# Patient Record
Sex: Female | Born: 2000 | Hispanic: No | Marital: Single | Smoking: Never smoker
Health system: Southern US, Community
[De-identification: ages and names within clinical notes are randomized; demographics above are authoritative.]

## PROBLEM LIST (undated history)

## (undated) DIAGNOSIS — G43909 Migraine, unspecified, not intractable, without status migrainosus: Secondary | ICD-10-CM

## (undated) DIAGNOSIS — Q185 Microstomia: Secondary | ICD-10-CM

## (undated) DIAGNOSIS — S83249A Other tear of medial meniscus, current injury, unspecified knee, initial encounter: Secondary | ICD-10-CM

## (undated) DIAGNOSIS — F419 Anxiety disorder, unspecified: Secondary | ICD-10-CM

## (undated) DIAGNOSIS — M2631 Crowding of fully erupted teeth: Secondary | ICD-10-CM

## (undated) DIAGNOSIS — S83512A Sprain of anterior cruciate ligament of left knee, initial encounter: Secondary | ICD-10-CM

## (undated) DIAGNOSIS — S83207A Unspecified tear of unspecified meniscus, current injury, left knee, initial encounter: Secondary | ICD-10-CM

---
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
## 2018-01-30 ENCOUNTER — Other Ambulatory Visit: Payer: Self-pay

## 2018-01-30 ENCOUNTER — Encounter: Payer: Self-pay | Admitting: Student

## 2018-01-30 ENCOUNTER — Other Ambulatory Visit (HOSPITAL_COMMUNITY)
Admission: RE | Admit: 2018-01-30 | Discharge: 2018-01-30 | Disposition: A | Discharge status: Home / Self Care | Payer: Medicaid Other | Source: Ambulatory Visit | Attending: Family Medicine | Admitting: Family Medicine

## 2018-01-30 ENCOUNTER — Ambulatory Visit (INDEPENDENT_AMBULATORY_CARE_PROVIDER_SITE_OTHER): Payer: Medicaid Other | Admitting: Student

## 2018-01-30 VITALS — BP 102/70 | HR 92 | Temp 97.9°F | Wt 119.0 lb

## 2018-01-30 DIAGNOSIS — N926 Irregular menstruation, unspecified: Secondary | ICD-10-CM | POA: Diagnosis present

## 2018-01-30 DIAGNOSIS — N898 Other specified noninflammatory disorders of vagina: Secondary | ICD-10-CM | POA: Insufficient documentation

## 2018-01-30 DIAGNOSIS — R109 Unspecified abdominal pain: Secondary | ICD-10-CM | POA: Diagnosis not present

## 2018-01-30 DIAGNOSIS — F39 Unspecified mood [affective] disorder: Secondary | ICD-10-CM | POA: Diagnosis not present

## 2018-01-30 LAB — POCT URINE PREGNANCY: Preg Test, Ur: NEGATIVE

## 2018-01-30 LAB — POCT WET PREP (WET MOUNT)
Clue Cells Wet Prep Whiff POC: NEGATIVE
Trichomonas Wet Prep HPF POC: ABSENT
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
## 2018-01-30 NOTE — Patient Instructions (Addendum)
It was great seeing you today! We have addressed the following issues today  Bloating and cramping: this could be due to anxiety.  It could also be due to acid reflux. We gave you a number to our behavioral health clinicians if you would like to talk to someone about nonpharmacological management of anxiety/stress.  You can also try acid reflux medication such as ranitidine/Zantac which is available over-the-counter.  Vaginal discharge: Your test for bacterial vaginosis, yeast and trichomoniasis is negative.  We will let you know about you at the practice in the next 2 to 3 days if anything is remarkable.  Your pregnancy test is negative.  If we did any lab work today, and the results require attention, either me or my nurse will get in touch with you. If everything is normal, you will get a letter in mail and a message via . If you don't hear from us in two weeks, please give us a call. Otherwise, we look forward to seeing you again at your next visit. If you have any questions or concerns before then, please call the clinic at [phone].  Please bring all your medications to every doctors visit  Sign up for My Chart to have easy access to your labs results, and communication with your Primary care physician.    Please check-out at the front desk before leaving the clinic.    Take Care,   Dr. Alanda SlimGonfa

## 2018-01-31 ENCOUNTER — Encounter: Payer: Self-pay | Admitting: Student

## 2018-01-31 LAB — CERVICOVAGINAL ANCILLARY ONLY
Chlamydia: NEGATIVE
Neisseria Gonorrhea: NEGATIVE

## 2018-09-18 ENCOUNTER — Other Ambulatory Visit: Payer: Self-pay

## 2018-09-18 ENCOUNTER — Ambulatory Visit (INDEPENDENT_AMBULATORY_CARE_PROVIDER_SITE_OTHER): Payer: Medicaid Other | Admitting: Family Medicine

## 2018-09-18 ENCOUNTER — Encounter: Payer: Self-pay | Admitting: Family Medicine

## 2018-09-18 VITALS — BP 100/70 | HR 90 | Temp 98.0°F | Wt 120.4 lb

## 2018-09-18 DIAGNOSIS — R432 Parageusia: Secondary | ICD-10-CM | POA: Diagnosis not present

## 2018-09-18 DIAGNOSIS — H1013 Acute atopic conjunctivitis, bilateral: Secondary | ICD-10-CM | POA: Diagnosis present

## 2018-09-18 DIAGNOSIS — Z23 Encounter for immunization: Secondary | ICD-10-CM

## 2018-09-18 MED ORDER — OLOPATADINE HCL 0.1 % OP SOLN
1.0000 [drp] | Freq: Two times a day (BID) | OPHTHALMIC | 12 refills | Status: AC
Start: 2018-09-18 — End: ?

## 2018-09-18 MED ORDER — TETANUS-DIPHTH-ACELL PERTUSSIS 5-2.5-18.5 LF-MCG/0.5 IM SUSP: 0.5000 mL | Freq: Once | INTRAMUSCULAR | 0 refills | Status: AC

## 2018-09-18 NOTE — Progress Notes (Signed)
    Subjective:    Patient ID: Amanda Acevedo, female    DOB: 30-May-2001, 18 y.o.   MRN: [id number]   CC: loss of taste  HPI: patient reports about a week ago she noticed foods tasted very bland to her. She was unable to taste very spicy or sweet foods. This was obviously worrisome so she made appointment to make sure things were ok. She reports taste has slowly started to come back and is about 80% back to normal today. She endorses nasal congestion and rhinorrhea last week as well. No fevers or chills. No rashes, joint pains, dry mouth, dry eyes. She denies seasonal allergies but does endorse itchy watery eyes frequently.    Smoking status reviewed- non-smoker  Review of Systems-see HPI   Objective:  BP 100/70   Pulse 90   Temp 98 F (36.7 C) (Oral)   Wt 120 lb 6 oz (54.6 kg)   LMP 09/02/2018   SpO2 99%   BMI 20.66 kg/m  Vitals and nursing note reviewed  General: well nourished, in no acute distress HEENT: normocephalic, TM's visualized bilaterally are normal, no bulging or erythema, no scleral icterus or conjunctival pallor, no nasal discharge, moist mucous membranes, good dentition without erythema or discharge noted in posterior oropharynx Neck: supple, non-tender, without lymphadenopathy Cardiac: RRR, clear S1 and S2, no murmurs, rubs, or gallops Respiratory: clear to auscultation bilaterally, no increased work of breathing Neuro: alert and oriented, no focal deficits   Assessment & Plan:    1. Allergic conjunctivitis of both eyes rx for pataday sent to pharmacy  2. Dysgeusia Likely related to nasal congestion, self resolving. Asked patient to return if she feels taste does not completely return or she has other concerns. At that time could do autoimmune work up.   3. Need for immunization against influenza - Flu Vaccine QUAD 36+ mos IM   Return as needed.   Dolores Patty, DO Family Medicine Resident PGY-3

## 2018-09-18 NOTE — Patient Instructions (Addendum)
  Please use eye drops twice a day for watery/itchy eyes.  If you feel your sense of taste does not recover totally or goes away again please let me know!  If you have questions or concerns please do not hesitate to call at [phone]. Dolores Patty, DO PGY-3, [facility] 09/18/2018 2:59 PM   Allergic Conjunctivitis A clear membrane (conjunctiva) covers the white part of your eye and the inner surface of your eyelid. Allergic conjunctivitis happens when this membrane has inflammation. This is caused by allergies. Common causes of allergic reactions (allergens)include:  Outdoor allergens, such as: ? Pollen. ? Grass and weeds. ? Mold spores.  Indoor allergens, such as: ? Dust. ? Smoke. ? Mold. ? Pet dander. ? Animal hair. This condition can make your eye red or pink. It can also make your eye feel itchy. This condition cannot be spread from one person to another person (is not contagious). Follow these instructions at home:  Try not to be around things that you are allergic to.  Take or apply over-the-counter and prescription medicines only as told by your doctor. These include any eye drops.  Place a cool, clean washcloth on your eye for 10-20 minutes. Do this 3-4 times a day.  Do not touch or rub your eyes.  Do not wear contact lenses until the inflammation is gone. Wear glasses instead.  Do not wear eye makeup until the inflammation is gone.  Keep all follow-up visits as told by your doctor. This is important. Contact a doctor if:  Your symptoms get worse.  Your symptoms do not get better with treatment.  You have mild eye pain.  You are sensitive to light,  You have spots or blisters on your eyes.  You have pus coming from your eye.  You have a fever. Get help right away if:  You have redness, swelling, or other symptoms in only one eye.  Your vision is blurry.  You have vision changes.  You have very bad eye pain. Summary  Allergic  conjunctivitis is caused by allergies. It can make your eye red or pink, and it can make your eye feel itchy.  This condition cannot be spread from one person to another person (is not contagious).  Try not to be around things that you are allergic to.  Take or apply over-the-counter and prescription medicines only as told by your doctor. These include any eye drops.  Contact your doctor if your symptoms get worse or they do not get better with treatment. This information is not intended to replace advice given to you by your health care provider. Make sure you discuss any questions you have with your health care provider. Document Released: 01/11/2010 Document Revised: 03/17/2016 Document Reviewed: 03/17/2016 Elsevier Interactive Patient Education  [employer].

## 2019-10-06 ENCOUNTER — Encounter: Payer: Self-pay | Admitting: Family Medicine

## 2019-10-06 ENCOUNTER — Ambulatory Visit (INDEPENDENT_AMBULATORY_CARE_PROVIDER_SITE_OTHER): Payer: Medicaid Other | Admitting: Family Medicine

## 2019-10-06 ENCOUNTER — Other Ambulatory Visit: Payer: Self-pay

## 2019-10-06 VITALS — BP 110/78 | HR 114 | Wt 130.0 lb

## 2019-10-06 DIAGNOSIS — F419 Anxiety disorder, unspecified: Secondary | ICD-10-CM

## 2019-10-06 DIAGNOSIS — F329 Major depressive disorder, single episode, unspecified: Secondary | ICD-10-CM | POA: Diagnosis not present

## 2019-10-06 DIAGNOSIS — F418 Other specified anxiety disorders: Secondary | ICD-10-CM

## 2019-10-06 DIAGNOSIS — F32A Depression, unspecified: Secondary | ICD-10-CM

## 2019-10-06 MED ORDER — ESCITALOPRAM OXALATE 10 MG PO TABS: 10.0000 mg | ORAL_TABLET | Freq: Every day | ORAL | 3 refills | Status: AC

## 2019-10-06 NOTE — Patient Instructions (Signed)
Generalized Anxiety Disorder, Adult Generalized anxiety disorder (GAD) is a mental health disorder. People with this condition constantly worry about everyday events. Unlike normal anxiety, worry related to GAD is not triggered by a specific event. These worries also do not fade or get better with time. GAD interferes with life functions, including relationships, work, and school. GAD can vary from mild to severe. People with severe GAD can have intense waves of anxiety with physical symptoms (panic attacks). What are the causes? The exact cause of GAD is not known. What increases the risk? This condition is more likely to develop in:  Women.  People who have a family history of anxiety disorders.  People who are very shy.  People who experience very stressful life events, such as the death of a loved one.  People who have a very stressful family environment. What are the signs or symptoms? People with GAD often worry excessively about many things in their lives, such as their health and family. They may also be overly concerned about:  Doing well at work.  Being on time.  Natural disasters.  Friendships. Physical symptoms of GAD include:  Fatigue.  Muscle tension or having muscle twitches.  Trembling or feeling shaky.  Being easily startled.  Feeling like your heart is pounding or racing.  Feeling out of breath or like you cannot take a deep breath.  Having trouble falling asleep or staying asleep.  Sweating.  Nausea, diarrhea, or irritable bowel syndrome (IBS).  Headaches.  Trouble concentrating or remembering facts.  Restlessness.  Irritability. How is this diagnosed? Your health care provider can diagnose GAD based on your symptoms and medical history. You will also have a physical exam. The health care provider will ask specific questions about your symptoms, including how severe they are, when they started, and if they come and go. Your health care  provider may ask you about your use of alcohol or drugs, including prescription medicines. Your health care provider may refer you to a mental health specialist for further evaluation. Your health care provider will do a thorough examination and may perform additional tests to rule out other possible causes of your symptoms. To be diagnosed with GAD, a person must have anxiety that:  Is out of his or her control.  Affects several different aspects of his or her life, such as work and relationships.  Causes distress that makes him or her unable to take part in normal activities.  Includes at least three physical symptoms of GAD, such as restlessness, fatigue, trouble concentrating, irritability, muscle tension, or sleep problems. Before your health care provider can confirm a diagnosis of GAD, these symptoms must be present more days than they are not, and they must last for six months or longer. How is this treated? The following therapies are usually used to treat GAD:  Medicine. Antidepressant medicine is usually prescribed for long-term daily control. Antianxiety medicines may be added in severe cases, especially when panic attacks occur.  Talk therapy (psychotherapy). Certain types of talk therapy can be helpful in treating GAD by providing support, education, and guidance. Options include: ? Cognitive behavioral therapy (CBT). People learn coping skills and techniques to ease their anxiety. They learn to identify unrealistic or negative thoughts and behaviors and to replace them with positive ones. ? Acceptance and commitment therapy (ACT). This treatment teaches people how to be mindful as a way to cope with unwanted thoughts and feelings. ? Biofeedback. This process trains you to manage your body's response (  physiological response) through breathing techniques and relaxation methods. You will work with a therapist while machines are used to monitor your physical symptoms.  Stress  management techniques. These include yoga, meditation, and exercise. A mental health specialist can help determine which treatment is best for you. Some people see improvement with one type of therapy. However, other people require a combination of therapies. Follow these instructions at home:  Take over-the-counter and prescription medicines only as told by your health care provider.  Try to maintain a normal routine.  Try to anticipate stressful situations and allow extra time to manage them.  Practice any stress management or self-calming techniques as taught by your health care provider.  Do not punish yourself for setbacks or for not making progress.  Try to recognize your accomplishments, even if they are small.  Keep all follow-up visits as told by your health care provider. This is important. Contact a health care provider if:  Your symptoms do not get better.  Your symptoms get worse.  You have signs of depression, such as: ? A persistently sad, cranky, or irritable mood. ? Loss of enjoyment in activities that used to bring you joy. ? Change in weight or eating. ? Changes in sleeping habits. ? Avoiding friends or family members. ? Loss of energy for normal tasks. ? Feelings of guilt or worthlessness. Get help right away if:  You have serious thoughts about hurting yourself or others. If you ever feel like you may hurt yourself or others, or have thoughts about taking your own life, get help right away. You can go to your nearest emergency department or call:  Your local emergency services (911 in the [place]  A suicide crisis helpline, such as the National Suicide Prevention Lifeline at 236-506-3342. This is open 24 hours a day. Summary  Generalized anxiety disorder (GAD) is a mental health disorder that involves worry that is not triggered by a specific event.  People with GAD often worry excessively about many things in their lives, such as their health and  family.  GAD may cause physical symptoms such as restlessness, trouble concentrating, sleep problems, frequent sweating, nausea, diarrhea, headaches, and trembling or muscle twitching.  A mental health specialist can help determine which treatment is best for you. Some people see improvement with one type of therapy. However, other people require a combination of therapies. This information is not intended to replace advice given to you by your health care provider. Make sure you discuss any questions you have with your health care provider. Document Revised: 07/06/2017 Document Reviewed: 06/13/2016 Elsevier Patient Education  2020 [employer].  Support in a Crisis What if I or someone I know is in crisis?  . If you are thinking about harming yourself or having thoughts of suicide, or if you know someone who is, seek help right away. . Call your doctor or mental health care provider. . Call 911 or go to a hospital emergency room to get immediate help, or ask a friend or family member to help you do these things. . Call the [place] National Suicide Prevention Lifeline's toll-free, 24-hour hotline at 1-800-273-TALK 606-031-2487) or TTY: 1-800-799-4 TTY 605-472-8910) to talk to a trained counselor. . If you are in crisis, make sure you are not left alone.  . If someone else is in crisis, make sure he or she is not left alone  24 Hour Availability [facility]  [address], [place], [place] [address]  [phone] or [phone]  [facility] [employer] (Domestic Violence, Rape & Victim Assistance [phone]  [place]  [address], [place]  [address]               [phone] or [phone]  [place]    (ONLY from 8am-4pm)    [phone]  [facility] (24/7)   4588632762  [place] National Suicide Hotline   509-536-5903  Diamantina Monks)

## 2019-10-08 ENCOUNTER — Telehealth: Payer: Self-pay

## 2019-10-08 ENCOUNTER — Encounter: Payer: Self-pay | Admitting: Family Medicine

## 2019-10-08 NOTE — Telephone Encounter (Signed)
Patient calls nurse line stating she experienced a panic attack in the middle of the night. Patient did state she feels it was more food related, as she had beef intestines and does not they were cooked all the way. Patient stated she had "GI" issues and then experienced the attack. Patient states she feels much better today, and believes she just had a touch of food poisoning.Denies fevers or chills or continues GI upset. Patient also stated she does not feel the medication give to her by Janee Morn on 3/1 is helping. In fact, patient stated she felt worse on it. I advised her to stop taking. I offered her a virtual to discuss switching to an alternative. Patient declined and stated, "I feel better when I am not on anything." Patient denies harming herself or others. Patient was told to schedule an apt if she feels her anxiety is not manageable. Patient agreed.

## 2019-10-08 NOTE — Progress Notes (Signed)
    SUBJECTIVE:   CHIEF COMPLAINT / HPI:   Patient reports worsening anxiety over the past several months.  Patient with significant history of generalized anxiety disorder.  She has been on medication in the past.  She has not followed up with pediatric psychiatry in the past.  Prior medications include Zoloft and Seroquel.  Patient discontinued these due to the combination making her too sleepy.  Patient has been unmedicated for approximately 2 years.  Depression screen [facility] 2/9 10/06/2019 01/30/2018 05/18/2017 05/18/2017 07/26/2016  Decreased Interest 2 0 3 3 3   Down, Depressed, Hopeless 3 0 3 3 3   PHQ - 2 Score 5 0 6 6 6   Altered sleeping 3 - 1 - 3  Tired, decreased energy 3 - 3 - 3  Change in appetite 3 - 2 - 2  Feeling bad or failure about yourself  3 - 1 - 3  Trouble concentrating 2 - 3 - 3  Moving slowly or fidgety/restless 3 - 3 - 3  Suicidal thoughts 1 - 0 - 3  PHQ-9 Score 23 - 19 - 26  Difficult doing work/chores Very difficult - Very difficult - -    GAD 7 : Generalized Anxiety Score 10/06/2019 07/26/2016  Nervous, Anxious, on Edge 3 3  Control/stop worrying 3 3  Worry too much - different things 3 3  Trouble relaxing 3 3  Restless 3 3  Easily annoyed or irritable 3 3  Afraid - awful might happen 3 3  Total GAD 7 Score 21 21  Anxiety Difficulty Very difficult Very difficult    Patient with passive SI.  Patient without plans or active thoughts to hurt herself.  Attributes a lot of difficulty to isolation associate with pandemic.  Patient is doing restarting new medication.  She is not interested in counseling at this time.  PERTINENT  PMH / PSH:  Anxiety and depression  OBJECTIVE:   BP 110/78   Pulse (!) 114   Wt 130 lb (59 kg)   LMP 09/25/2019 (Exact Date)   SpO2 95%   BMI 22.31 kg/m    Gen: NAD, resting comfortably CV: RRR with no murmurs appreciated Pulm: NWOB, CTAB with no crackles, wheezes, or rhonchi GI:  Soft, Nontender, Nondistended. MSK: no edema,  cyanosis, or clubbing noted Skin: warm, dry Neuro: grossly normal, moves all extremities   ASSESSMENT/PLAN:   Anxiety with depression Patient with elevated PHQ-9 and GAD-7 consistent with prior history.  Passive SI but no active plans patient is with family and is in a safe environment. -Trial SSRI -Patient follow-up in 4 weeks -Suicide hotline and other resources provided     12/06/2019, MD [facility]

## 2019-10-08 NOTE — Assessment & Plan Note (Signed)
Patient with elevated PHQ-9 and GAD-7 consistent with prior history.  Passive SI but no active plans patient is with family and is in a safe environment. -Trial SSRI -Patient follow-up in 4 weeks -Suicide hotline and other resources provided

## 2019-10-28 ENCOUNTER — Other Ambulatory Visit: Payer: Self-pay

## 2019-10-28 ENCOUNTER — Ambulatory Visit (INDEPENDENT_AMBULATORY_CARE_PROVIDER_SITE_OTHER): Payer: Medicaid Other | Admitting: Family Medicine

## 2019-10-28 VITALS — BP 110/68 | HR 110 | Ht 65.0 in | Wt 125.2 lb

## 2019-10-28 DIAGNOSIS — F39 Unspecified mood [affective] disorder: Secondary | ICD-10-CM | POA: Diagnosis not present

## 2019-10-28 DIAGNOSIS — F418 Other specified anxiety disorders: Secondary | ICD-10-CM

## 2019-10-28 NOTE — Progress Notes (Signed)
    SUBJECTIVE:   CHIEF COMPLAINT / HPI:   Panic Attacks Patient has been experiencing panic attacks. Patient reports that she has been very anxious, nervous and has a fear of death. Patient was placed on SSRI at last visit with Dr. Janee Morn.  Patient reports it made her feel weird so she discontinued it. The first time this happened, she made beef intestine for dinner and she woke up in the middle of the night and had diarrhea.  She is afraid that the beef intestine is going to stay in her system and cause damage to her health.  With these thoughts, patient experienced panic attack, which she has never experienced before.  She reports that it felt like she was going to have a heart attack.  She was shaky, felt very cold, her heart was racing, felt like she was coming out of her body.  She feels like the experience that she is coming out of her body might be due to eating edibles unknowingly.  She describes that she is having psychosis during her panic attacks and that she feels unreal.  She does have support with her boyfriend.  She denies any suicidal, homicidal ideation.  She denies any auditory visual hallucinations.   PERTINENT  PMH / PSH: No psychiatric or medical hospitalizations, anxiety, depression  OBJECTIVE:   BP 110/68   Pulse (!) 110   Ht 5\' 5"  (1.651 m)   Wt 125 lb 4 oz (56.8 kg)   LMP 10/26/2019   SpO2 99%   BMI 20.84 kg/m   Well-appearing female, no acute distress, well groomed  Psych: Patient speech is normal in rate, latency, volume, intonations.  She appears to be mildly restless and plays with her fingers during most of the appointment.  She has normal eye contact and normal mannerisms.  Patient's thought process appears logical, goal directed.  She ruminates about dying after eating certain foods.  Patient overall appears anxious, consistent affect.  She has full range of appropriate emotions.  ASSESSMENT/PLAN:   Anxiety with depression Discussed some tools for  patient to help during times of panic and anxiety.  This includes exercises such as closed eyes with 5 senses, deep breathing, being thoughtful and investigating some of her irrational thoughts further.  Patient does not want to do any medications at this time for either anxiety or panic attacks. She would like to attempt to practice tools at home and also teach her boyfriend so that he can help if she is really struggling. Follow-up with patient in the next month for check-in.   Patient has previous history of active and passive suicidal ideation.  She denies today.    10/28/2019, MD [facility]

## 2019-10-31 ENCOUNTER — Encounter: Payer: Self-pay | Admitting: Family Medicine

## 2019-10-31 NOTE — Assessment & Plan Note (Addendum)
Discussed some tools for patient to help during times of panic and anxiety.  This includes exercises such as closed eyes with 5 senses, deep breathing, being thoughtful and investigating some of her irrational thoughts further.  Patient does not want to do any medications at this time for either anxiety or panic attacks. She would like to attempt to practice tools at home and also teach her boyfriend so that he can help if she is really struggling. Follow-up with patient in the next month for check-in.   Patient has previous history of active and passive suicidal ideation.  She denies today.

## 2021-12-30 ENCOUNTER — Ambulatory Visit: Payer: Medicaid Other | Admitting: Family Medicine

## 2021-12-30 DIAGNOSIS — F418 Other specified anxiety disorders: Secondary | ICD-10-CM

## 2021-12-30 MED ORDER — OMEPRAZOLE 20 MG PO CPDR: 20.0000 mg | DELAYED_RELEASE_CAPSULE | Freq: Every day | ORAL | 3 refills | Status: AC

## 2021-12-30 NOTE — Progress Notes (Unsigned)
    SUBJECTIVE:   CHIEF COMPLAINT / HPI:   Panic attack Patient presents for evaluation of panic attacks.  She has history of panic attacks and uses exercise to help deal with these.  Is not interested in medications and has not been in the past.  Reports that originally she has them infrequently but over the past few weeks they have increased in numbers.  They have been occurring when she wakes up in the morning.  She will be asleep and dreaming and she reports that the dream will just stop and she will wake up in a panic feeling like her heart is racing and feeling severe anxiety.  No shortness of breath.  These attacks typically resolve over a few minutes.  She does notice that they occur more when she is eating large meals before going to bed and she feels that acid reflux may be contributing to her feelings.  Denies any issues with depression at this time.  OBJECTIVE:   BP 109/76   Pulse 86   Ht 5\' 5"  (1.651 m)   Wt 131 lb 6.4 oz (59.6 kg)   LMP 11/05/2021   SpO2 100%   BMI 21.87 kg/m   General: Pleasant 21 year old female in no acute distress Cardiac: Regular rate and rhythm Respiratory: Normal breathing, speaking in full sentences Psych: Normal mood and affect, no anxiety on evaluation     12/30/2021    3:46 PM 10/28/2019    3:58 PM 10/06/2019    2:58 PM  PHQ9 SCORE ONLY  PHQ-9 Total Score 16 22 23     ASSESSMENT/PLAN:   Anxiety with depression Discussed patient's anxiety attacks at length today.  She will continue to use exercise as well as other things such as deep breathing to help with the anxiety attacks.  She is not interested in any medication such as SSRIs at this time.  Does feel that one contributor to her anxiety could be reflux because she notices it occurs after she has large meals.  We will trial omeprazole and see if it helps with symptoms.  Patient will consider other medications in the future.     12/06/2019, MD [facility]

## 2021-12-30 NOTE — Patient Instructions (Signed)
It was wonderful seeing you today!  I am sorry you are having issues with these panic attacks.  We can try to see if treating acid reflux symptoms will help decrease the occurrence of these panic attacks.  Would also like you to keep a diary of your dietary habits and any of your daily activities that you notice might be triggering this.  I would like for you to follow-up with your primary care provider in 2 to 3 weeks to see if everything is resolved.  If you are still having issues they can consider blood work at that time.  If you have any questions or concerns please call the clinic.  I hope you have a wonderful day!

## 2021-12-31 NOTE — Assessment & Plan Note (Signed)
Discussed patient's anxiety attacks at length today.  She will continue to use exercise as well as other things such as deep breathing to help with the anxiety attacks.  She is not interested in any medication such as SSRIs at this time.  Does feel that one contributor to her anxiety could be reflux because she notices it occurs after she has large meals.  We will trial omeprazole and see if it helps with symptoms.  Patient will consider other medications in the future.
